# Patient Record
Sex: Male | Born: 2000 | Race: White | Hispanic: No | Marital: Single | Smoking: Never smoker
Health system: Southern US, Community
[De-identification: ages and names within clinical notes are randomized; demographics above are authoritative.]

---
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
## 2021-08-19 DIAGNOSIS — S93401A Sprain of unspecified ligament of right ankle, initial encounter: Secondary | ICD-10-CM | POA: Insufficient documentation

## 2021-08-19 DIAGNOSIS — S82839A Other fracture of upper and lower end of unspecified fibula, initial encounter for closed fracture: Secondary | ICD-10-CM | POA: Insufficient documentation

## 2022-01-29 DIAGNOSIS — M519 Unspecified thoracic, thoracolumbar and lumbosacral intervertebral disc disorder: Secondary | ICD-10-CM | POA: Insufficient documentation

## 2022-02-26 ENCOUNTER — Ambulatory Visit
Admission: EM | Admit: 2022-02-26 | Discharge: 2022-02-26 | Disposition: A | Discharge status: Home / Self Care | Payer: PRIVATE HEALTH INSURANCE

## 2022-02-26 DIAGNOSIS — A084 Viral intestinal infection, unspecified: Secondary | ICD-10-CM

## 2022-02-26 MED ORDER — ONDANSETRON 4 MG PO TBDP: 4.0000 mg | ORAL_TABLET | Freq: Three times a day (TID) | ORAL | 0 refills | Status: AC | PRN

## 2022-02-26 NOTE — Discharge Instructions (Addendum)
Take the antinausea medication as directed.   ° °Keep yourself hydrated with clear liquids, such as water and Gatorade.   ° °Go to the emergency department if you have acute worsening symptoms.   ° °Follow up with your primary care provider if your symptoms are not improving.   ° ° ° °

## 2022-02-26 NOTE — ED Triage Notes (Signed)
Patient presents to Urgent Care with complaints of n/v, diarrhea, and back pain x 1 [name]. Treating symptoms with tylenol.

## 2022-02-26 NOTE — ED Provider Notes (Signed)
Renaldo Fiddler    CSN: [id number] Arrival date & time: 02/26/22  1534      History   Chief Complaint Chief Complaint  Patient presents with   Nausea   Emesis   Diarrhea   Back Pain    HPI Richard Key is a 21 y.o. male.  Accompanied by his mother, patient presents with nausea, vomiting, diarrhea, low back pain since yesterday evening.  2 episodes of emesis and 4-5 episodes of diarrhea today.  He has been able to drink liquids such as water and feels he is able to stay hydrated at home.  No fever, abdominal pain, dysuria, hematuria, or other symptoms.  Treating symptoms at home with Tylenol.    The history is provided by the patient, a parent and medical records.   History reviewed. No pertinent past medical history.  Patient Active Problem List   Diagnosis Date Noted   Lumbar disc disease 01/29/2022   Closed fracture of distal fibula 08/19/2021   Sprain of right ankle 08/19/2021    History reviewed. No pertinent surgical history.     Home Medications    Prior to Admission medications   Medication Sig Start Date End Date Taking? Authorizing Provider  omeprazole (PRILOSEC) 40 MG capsule Take 1 capsule by mouth daily. 12/29/21 12/29/22 Yes [provider]  ondansetron (ZOFRAN-ODT) 4 MG disintegrating tablet Take 1 tablet (4 mg total) by mouth every 8 (eight) hours as needed for nausea or vomiting. 02/26/22  Yes Mickie Bail, NP  ibuprofen (ADVIL,MOTRIN) 800 MG tablet Take 1 tablet (800 mg total) by mouth every 8 (eight) hours as needed. 07/15/16   Emily Filbert, MD    Family History History reviewed. No pertinent family history.  Social History Social History   Tobacco Use   Smoking status: Never   Smokeless tobacco: Never  Vaping Use   Vaping Use: Never used  Substance Use Topics   Alcohol use: Yes    Comment: social drinker   Drug use: No     Allergies   Penicillins   Review of Systems Review of Systems  Constitutional:   Negative for chills and fever.  Respiratory:  Negative for cough and shortness of breath.   Cardiovascular:  Negative for chest pain and palpitations.  Gastrointestinal:  Positive for diarrhea, nausea and vomiting. Negative for abdominal pain.  Genitourinary:  Negative for dysuria and hematuria.  Musculoskeletal:  Positive for back pain. Negative for gait problem.  Skin:  Negative for color change and rash.  Neurological:  Negative for weakness and numbness.  All other systems reviewed and are negative.   Physical Exam Triage Vital Signs ED Triage Vitals  Enc Vitals Group     BP 02/26/22 1609 117/64     Pulse Rate 02/26/22 1609 (!) 102     Resp 02/26/22 1609 18     Temp 02/26/22 1609 98.7 F (37.1 C)     Temp Source 02/26/22 1609 Temporal     SpO2 02/26/22 1609 100 %     Weight --      Height --      Head Circumference --      Peak Flow --      Pain Score 02/26/22 1607 7     Pain Loc --      Pain Edu? --      Excl. in GC? --    No data found.  Updated Vital Signs BP 117/64 (BP Location: Left Arm)   Pulse Marland Kitchen)  102   Temp 98.7 F (37.1 C) (Temporal)   Resp 18   SpO2 100%   Visual Acuity Right Eye Distance:   Left Eye Distance:   Bilateral Distance:    Right Eye Near:   Left Eye Near:    Bilateral Near:     Physical Exam Vitals and nursing note reviewed.  Constitutional:      General: He is not in acute distress.    Appearance: Normal appearance. He is well-developed. He is not ill-appearing.  HENT:     Mouth/Throat:     Mouth: Mucous membranes are moist.  Cardiovascular:     Rate and Rhythm: Normal rate and regular rhythm.     Heart sounds: Normal heart sounds.  Pulmonary:     Effort: Pulmonary effort is normal. No respiratory distress.     Breath sounds: Normal breath sounds.  Abdominal:     General: Bowel sounds are normal.     Palpations: Abdomen is soft.     Tenderness: There is no abdominal tenderness. There is no right CVA tenderness, left CVA  tenderness, guarding or rebound.  Musculoskeletal:     Cervical back: Neck supple.  Skin:    General: Skin is warm and dry.     Capillary Refill: Capillary refill takes less than 2 seconds.  Neurological:     General: No focal deficit present.     Mental Status: He is alert and oriented to person, place, and time.     Gait: Gait normal.  Psychiatric:        Mood and Affect: Mood normal.        Behavior: Behavior normal.     UC Treatments / Results  Labs (all labs ordered are listed, but only abnormal results are displayed) Labs Reviewed - No data to display  EKG   Radiology No results found.  Procedures Procedures (including critical care time)  Medications Ordered in UC Medications - No data to display  Initial Impression / Assessment and Plan / UC Course  I have reviewed the triage vital signs and the nursing notes.  Pertinent labs & imaging results that were available during my care of the patient were reviewed by me and considered in my medical decision making (see chart for details).   Viral gastroenteritis.  Treating nausea and vomiting with Zofran.  Discussed clear liquid diet.  Instructed patient to advance to diarrhea diet as tolerated.  Discussed maintaining oral hydration at home; ED precautions discussed.  Education provided on nausea and vomiting, diarrhea, abdominal pain.  Instructed patient to follow-up with his PCP as needed.  He agrees to plan of care.    Final Clinical Impressions(s) / UC Diagnoses   Final diagnoses:  Viral gastroenteritis     Discharge Instructions      Take the antinausea medication as directed.    Keep yourself hydrated with clear liquids, such as water and Gatorade.    Go to the emergency department if you have acute worsening symptoms.    Follow up with your primary care provider if your symptoms are not improving.          ED Prescriptions     Medication Sig Dispense Auth. Provider   ondansetron (ZOFRAN-ODT)  4 MG disintegrating tablet Take 1 tablet (4 mg total) by mouth every 8 (eight) hours as needed for nausea or vomiting. 20 tablet Mickie Bail, NP      PDMP not reviewed this encounter.   Mickie Bail, NP 02/26/22 1640

## 2023-12-12 ENCOUNTER — Emergency Department
Admission: EM | Admit: 2023-12-12 | Discharge: 2023-12-12 | Disposition: A | Discharge status: Home / Self Care | Payer: Worker's Compensation | Attending: Emergency Medicine | Admitting: Emergency Medicine

## 2023-12-12 ENCOUNTER — Other Ambulatory Visit: Payer: Self-pay

## 2023-12-12 DIAGNOSIS — M79621 Pain in right upper arm: Secondary | ICD-10-CM | POA: Diagnosis present

## 2023-12-12 DIAGNOSIS — R2231 Localized swelling, mass and lump, right upper limb: Secondary | ICD-10-CM | POA: Diagnosis not present

## 2023-12-12 DIAGNOSIS — W460XXA Contact with hypodermic needle, initial encounter: Secondary | ICD-10-CM | POA: Diagnosis not present

## 2023-12-12 NOTE — ED Notes (Signed)
 See triage note. The EMS patient that was being injected at the time of the needlestick was a 23 year old juvenile. Puncture site is just above the right elbow. No evidence of swelling or discoloration of skin. Bleeding controled at time of assessment.

## 2023-12-12 NOTE — ED Triage Notes (Signed)
 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 It was a dirty needle as the needle had already been stuck into the other pt, before this pt got stuck.   Tetanus UTD

## 2023-12-12 NOTE — ED Provider Notes (Signed)
 [facility] Emergency Department Provider Note     Event Date/Time   First MD Initiated Contact with Patient 12/12/23 2205     (approximate)   History   WC Needle Stick   HPI  Richard Key is a 23 y.o. male presents to the ED after needlestick injury that occurred while transporting a patient to the ED approximately 1 hour ago.  The needle was used to sedate a patient and was dirty when accidentally stuck into the patient.  The patient denies immediate symptoms such as redness, swelling or pain beyond the puncture site.  There is low risk factors for blood-borne pathogen exposure given history.  The patient is up-to-date on tetanus and hepatitis vaccines.  No known HIV status of patient.     Physical Exam   Triage Vital Signs: ED Triage Vitals [12/12/23 2140]  Encounter Vitals Group     BP      Systolic BP Percentile      Diastolic BP Percentile      Pulse      Resp      Temp      Temp src      SpO2      Weight 180 lb (81.6 kg)     Height 6' (1.829 m)     Head Circumference      Peak Flow      Pain Score 0     Pain Loc      Pain Education      Exclude from Growth Chart     Most recent vital signs: Vitals:   12/12/23 2310  BP: 130/75  Pulse: 89  Resp: 18  SpO2: 98%    General Awake, no distress.  HEENT NCAT.  CV:  Good peripheral perfusion.  RESP:  Normal effort.  ABD:  No distention.  Other:  Puncture site at right proximal-lateral forearm.  No erythema, swelling or active bleeding.   ED Results / Procedures / Treatments   Labs (all labs ordered are listed, but only abnormal results are displayed) Labs Reviewed - No data to display  No results found.  PROCEDURES:  Critical Care performed: No  Procedures   MEDICATIONS ORDERED IN ED: Medications - No data to display   IMPRESSION / MDM / ASSESSMENT AND PLAN / ED COURSE  I reviewed the triage vital signs and the nursing notes.                                23 y.o. male presents to the emergency department for evaluation and treatment of needlestick injury with potential blood-borne pathogen exposure low risk based on source and injury characteristics. See HPI for further details.   Patient's presentation is most consistent with acute complicated illness / injury requiring diagnostic workup.  Puncture site thoroughly cleansed in the ED.  No signs of swelling, pus or redness.  Discussed risk of HIV transmission being extremely low but not 0.  I encouraged him to follow-up with his PCP for possible HIV PEP.  Patient is up-to-date on hepatitis vaccines.  He is up-to-date on tetanus.  I do believe patient is in stable condition for discharge home and outpatient management as needed.  Patient verbalized understanding and is in agreement with this care plan.  FINAL CLINICAL IMPRESSION(S) / ED DIAGNOSES   Final diagnoses:  Accident caused by hypodermic needle, initial encounter    Rx / DC Orders  ED Discharge Orders     None        Note:  This document was prepared using Dragon voice recognition software and may include unintentional dictation errors.    Romeo Apple, Charnise Lovan A, PA-C 12/12/23 2313    Sharman Cheek, MD 12/16/23 [phone]

## 2023-12-12 NOTE — Discharge Instructions (Addendum)
 You were evaluated in the ED for needlestick injury, low risk exposure.  Continue routine wound care with soap and water.  Avoid harsh disinfectants.  Risk of HIV transmission is extremely low but not 0.  If concern, discussed HIV PEP with your primary provider or possible testing.  Monitor for any signs of infection at the site including redness, swelling and pus.  If flulike symptoms develop in the next few weeks return to the ER for further evaluation.  Follow-up with your occupational health or primary care in 4 to 6 weeks for optional HIV, hepatitis B, and hepatitis C testing.
